# Patient Record
Sex: Female | Born: 1995 | Race: Black or African American | Hispanic: No | Marital: Single | Smoking: Never smoker
Health system: Southern US, Community
[De-identification: ages and names within clinical notes are randomized; demographics above are authoritative.]

## PROBLEM LIST (undated history)

## (undated) DIAGNOSIS — O139 Gestational [pregnancy-induced] hypertension without significant proteinuria, unspecified trimester: Secondary | ICD-10-CM

## (undated) HISTORY — DX: Gestational (pregnancy-induced) hypertension without significant proteinuria, unspecified trimester: O13.9

## (undated) HISTORY — PX: WISDOM TOOTH EXTRACTION: SHX21

---

## 2018-02-18 ENCOUNTER — Ambulatory Visit (HOSPITAL_COMMUNITY)
Admission: EM | Admit: 2018-02-18 | Discharge: 2018-02-18 | Disposition: A | Discharge status: Other Acute Inpatient Hospital | Payer: Self-pay

## 2018-04-30 ENCOUNTER — Encounter: Payer: Self-pay | Admitting: Family Medicine

## 2018-04-30 ENCOUNTER — Ambulatory Visit (INDEPENDENT_AMBULATORY_CARE_PROVIDER_SITE_OTHER): Payer: Medicaid Other

## 2018-04-30 DIAGNOSIS — Z789 Other specified health status: Secondary | ICD-10-CM

## 2018-04-30 DIAGNOSIS — Z3201 Encounter for pregnancy test, result positive: Secondary | ICD-10-CM

## 2018-04-30 LAB — POCT PREGNANCY, URINE: Preg Test, Ur: POSITIVE — AB

## 2018-04-30 NOTE — Progress Notes (Signed)
Pt here today for pregnancy test.  Resulted positive.   Pt denies any vaginal bleeding or pain.  Pt unsure of LMP but reports having a period in November but not in December.  Medications reconciled.  List of medicines safe to take in pregnancy given.  OB US scheduled due to pt unsure of LMP.  OB US scheduled for 05/07/18 @ 1545.  Proof of pregnancy letter given by the front office.

## 2018-05-01 NOTE — Progress Notes (Signed)
Chart reviewed for nurse visit. Agree with plan of care.   Fernandez Kenley Lauren, DO 05/01/2018 11:34 AM  

## 2018-05-06 ENCOUNTER — Encounter (HOSPITAL_COMMUNITY): Payer: Self-pay

## 2018-05-07 ENCOUNTER — Ambulatory Visit (HOSPITAL_COMMUNITY)
Admission: RE | Admit: 2018-05-07 | Discharge: 2018-05-07 | Disposition: A | Discharge status: Home / Self Care | Payer: Medicaid Other | Source: Ambulatory Visit | Attending: Family Medicine | Admitting: Family Medicine

## 2018-05-07 DIAGNOSIS — Z789 Other specified health status: Secondary | ICD-10-CM | POA: Insufficient documentation

## 2018-05-31 ENCOUNTER — Other Ambulatory Visit: Payer: Self-pay

## 2018-05-31 ENCOUNTER — Ambulatory Visit (INDEPENDENT_AMBULATORY_CARE_PROVIDER_SITE_OTHER): Payer: Medicaid Other | Admitting: *Deleted

## 2018-05-31 ENCOUNTER — Encounter: Payer: Self-pay | Admitting: Family Medicine

## 2018-05-31 ENCOUNTER — Other Ambulatory Visit (HOSPITAL_COMMUNITY)
Admission: RE | Admit: 2018-05-31 | Discharge: 2018-05-31 | Disposition: A | Discharge status: Home / Self Care | Payer: Medicaid Other | Source: Ambulatory Visit | Attending: Family Medicine | Admitting: Family Medicine

## 2018-05-31 VITALS — BP 121/77 | HR 88 | Ht 64.75 in | Wt 242.0 lb

## 2018-05-31 DIAGNOSIS — Z8759 Personal history of other complications of pregnancy, childbirth and the puerperium: Secondary | ICD-10-CM

## 2018-05-31 DIAGNOSIS — O0990 Supervision of high risk pregnancy, unspecified, unspecified trimester: Secondary | ICD-10-CM | POA: Diagnosis present

## 2018-05-31 DIAGNOSIS — Z8751 Personal history of pre-term labor: Secondary | ICD-10-CM

## 2018-05-31 LAB — POCT URINALYSIS DIP (DEVICE)
Bilirubin Urine: NEGATIVE
GLUCOSE, UA: NEGATIVE mg/dL
Hgb urine dipstick: NEGATIVE
Ketones, ur: NEGATIVE mg/dL
LEUKOCYTE UA: NEGATIVE
Nitrite: NEGATIVE
Protein, ur: NEGATIVE mg/dL
Specific Gravity, Urine: 1.025 (ref 1.005–1.030)
Urobilinogen, UA: 1 mg/dL (ref 0.0–1.0)
pH: 6.5 (ref 5.0–8.0)

## 2018-05-31 NOTE — Progress Notes (Signed)
New Ob intake completed and pregnancy information packet given. Pt reports hx of GHTN with 2nd pregnancy resulting in induction of labor @ 37 wks.. She also had preterm labor during both pregnancies however did not have PTD. Both babies born in [place], [place]. Labs drawn and [place] for anatomy scheduled.

## 2018-06-02 LAB — CULTURE, OB URINE

## 2018-06-02 LAB — URINE CULTURE, OB REFLEX: Organism ID, Bacteria: NO GROWTH

## 2018-06-03 ENCOUNTER — Telehealth: Payer: Self-pay

## 2018-06-03 NOTE — Telephone Encounter (Signed)
Called pt to advise of [place] SCHEDULED FOR 2/21 @ 3pm. Pt verbalized understanding.

## 2018-06-03 NOTE — Telephone Encounter (Signed)
-----   Message from Drucilla Schmidt Day, RN sent at 05/31/2018 12:54 PM EST ----- Regarding: [place] for anatomy Pt left office prior to [place] scheduling. Please notify her of [place] for anatomy scheduled on 2/21 @ 3 pm.

## 2018-06-04 LAB — GC/CHLAMYDIA PROBE AMP (~~LOC~~) NOT AT ARMC
Chlamydia: NEGATIVE
Neisseria Gonorrhea: NEGATIVE

## 2018-06-07 ENCOUNTER — Encounter (HOSPITAL_COMMUNITY): Payer: Self-pay

## 2018-06-07 ENCOUNTER — Ambulatory Visit (HOSPITAL_COMMUNITY)
Admission: RE | Admit: 2018-06-07 | Discharge: 2018-06-07 | Disposition: A | Discharge status: Home / Self Care | Payer: Medicaid Other | Source: Ambulatory Visit | Attending: Advanced Practice Midwife | Admitting: Advanced Practice Midwife

## 2018-06-07 DIAGNOSIS — O99212 Obesity complicating pregnancy, second trimester: Secondary | ICD-10-CM | POA: Diagnosis not present

## 2018-06-07 DIAGNOSIS — Z8759 Personal history of other complications of pregnancy, childbirth and the puerperium: Secondary | ICD-10-CM | POA: Diagnosis present

## 2018-06-07 DIAGNOSIS — Z3A17 17 weeks gestation of pregnancy: Secondary | ICD-10-CM

## 2018-06-07 DIAGNOSIS — Z8751 Personal history of pre-term labor: Secondary | ICD-10-CM | POA: Insufficient documentation

## 2018-06-07 DIAGNOSIS — Z363 Encounter for antenatal screening for malformations: Secondary | ICD-10-CM

## 2018-06-07 DIAGNOSIS — O132 Gestational [pregnancy-induced] hypertension without significant proteinuria, second trimester: Secondary | ICD-10-CM

## 2018-06-07 DIAGNOSIS — O0990 Supervision of high risk pregnancy, unspecified, unspecified trimester: Secondary | ICD-10-CM | POA: Diagnosis present

## 2018-06-11 ENCOUNTER — Other Ambulatory Visit (HOSPITAL_COMMUNITY): Payer: Self-pay | Admitting: *Deleted

## 2018-06-11 DIAGNOSIS — O132 Gestational [pregnancy-induced] hypertension without significant proteinuria, second trimester: Secondary | ICD-10-CM

## 2018-06-13 LAB — OBSTETRIC PANEL, INCLUDING HIV
Antibody Screen: NEGATIVE
Basophils Absolute: 0 10*3/uL (ref 0.0–0.2)
Basos: 0 %
EOS (ABSOLUTE): 0.1 10*3/uL (ref 0.0–0.4)
EOS: 1 %
HIV Screen 4th Generation wRfx: NONREACTIVE
Hematocrit: 34.8 % (ref 34.0–46.6)
Hemoglobin: 10.5 g/dL — ABNORMAL LOW (ref 11.1–15.9)
Hepatitis B Surface Ag: NEGATIVE
Immature Grans (Abs): 0 10*3/uL (ref 0.0–0.1)
Immature Granulocytes: 0 %
Lymphocytes Absolute: 2.2 10*3/uL (ref 0.7–3.1)
Lymphs: 25 %
MCH: 21.3 pg — ABNORMAL LOW (ref 26.6–33.0)
MCHC: 30.2 g/dL — ABNORMAL LOW (ref 31.5–35.7)
MCV: 70 fL — ABNORMAL LOW (ref 79–97)
Monocytes Absolute: 0.8 10*3/uL (ref 0.1–0.9)
Monocytes: 9 %
Neutrophils Absolute: 5.7 10*3/uL (ref 1.4–7.0)
Neutrophils: 65 %
Platelets: 209 10*3/uL (ref 150–450)
RBC: 4.94 x10E6/uL (ref 3.77–5.28)
RDW: 17.1 % — ABNORMAL HIGH (ref 11.7–15.4)
RH TYPE: POSITIVE
RPR: NONREACTIVE
Rubella Antibodies, IGG: 2.46 index (ref >0.99)
WBC: 8.8 10*3/uL (ref 3.4–10.8)

## 2018-06-13 LAB — HEMOGLOBINOPATHY EVALUATION
Ferritin: 8 ng/mL — ABNORMAL LOW (ref 15–150)
Hgb A2 Quant: 1.7 % — ABNORMAL LOW (ref 1.8–3.2)
Hgb A: 98.3 % (ref 96.4–98.8)
Hgb C: 0 %
Hgb F Quant: 0 % (ref 0.0–2.0)
Hgb S: 0 %
Hgb Solubility: NEGATIVE
Hgb Variant: 0 %

## 2018-06-13 LAB — INHERITEST(R) CF/SMA PANEL

## 2018-06-17 ENCOUNTER — Other Ambulatory Visit: Payer: Self-pay

## 2018-06-17 ENCOUNTER — Encounter: Payer: Self-pay | Admitting: Obstetrics & Gynecology

## 2018-06-17 ENCOUNTER — Encounter: Payer: Self-pay | Admitting: Family Medicine

## 2018-06-17 ENCOUNTER — Encounter: Payer: Medicaid Other | Admitting: Advanced Practice Midwife

## 2018-06-17 ENCOUNTER — Ambulatory Visit (INDEPENDENT_AMBULATORY_CARE_PROVIDER_SITE_OTHER): Payer: Medicaid Other | Admitting: Obstetrics & Gynecology

## 2018-06-17 ENCOUNTER — Other Ambulatory Visit (HOSPITAL_COMMUNITY)
Admission: RE | Admit: 2018-06-17 | Discharge: 2018-06-17 | Disposition: A | Discharge status: Home / Self Care | Payer: Medicaid Other | Source: Ambulatory Visit | Attending: Advanced Practice Midwife | Admitting: Advanced Practice Midwife

## 2018-06-17 VITALS — BP 123/66 | HR 99 | Wt 244.1 lb

## 2018-06-17 DIAGNOSIS — Z8759 Personal history of other complications of pregnancy, childbirth and the puerperium: Secondary | ICD-10-CM

## 2018-06-17 DIAGNOSIS — Z23 Encounter for immunization: Secondary | ICD-10-CM

## 2018-06-17 DIAGNOSIS — N898 Other specified noninflammatory disorders of vagina: Secondary | ICD-10-CM | POA: Diagnosis not present

## 2018-06-17 DIAGNOSIS — O0990 Supervision of high risk pregnancy, unspecified, unspecified trimester: Secondary | ICD-10-CM

## 2018-06-17 DIAGNOSIS — O0992 Supervision of high risk pregnancy, unspecified, second trimester: Secondary | ICD-10-CM

## 2018-06-17 DIAGNOSIS — Z3A19 19 weeks gestation of pregnancy: Secondary | ICD-10-CM

## 2018-06-17 MED ORDER — COMPLETENATE 29-1 MG PO CHEW: 1.0000 | CHEWABLE_TABLET | Freq: Every day | ORAL | Status: DC

## 2018-06-17 MED ORDER — ASPIRIN EC 81 MG PO TBEC: 81.0000 mg | DELAYED_RELEASE_TABLET | Freq: Every day | ORAL | 2 refills | Status: DC

## 2018-06-17 NOTE — Patient Instructions (Signed)

## 2018-06-17 NOTE — Progress Notes (Signed)
Subjective:    Felicia Valdez is a [id number] [redacted]w[redacted]d being seen today for her first obstetrical visit.  Her obstetrical history is significant for GHTN. Patient does intend to breast feed. Pregnancy history fully reviewed.  Patient reports that she is doing well.  She notes that she has not had prior prenatal care this pregnancy, and is not taking any medications, including prenatal vitamins.  She notes that her nausea has resolved and reports normal fetal movement.  She does note intermittant right lower quadrant pain to right periumbilical pain over the past couple of weeks.  She states the episodes last approximately 5 minustes and are a 5/10 in severity.  She has not taken anything for the pain.  .  Vitals:   06/17/18 0836  BP: 123/66  Pulse: 99  Weight: 244 lb 1.6 oz (110.7 kg)    HISTORY: OB History  Gravida Para Term Preterm AB Living  3 2 2     2   SAB TAB Ectopic Multiple Live Births          2    # Outcome Date GA Lbr Len/2nd Weight Sex Delivery Anes PTL Lv  3 Current           2 Term 05/26/17 [redacted]w[redacted]d  6 lb (2.722 kg) F  EPI Y LIV  1 Term 05/21/15 [redacted]w[redacted]d  6 lb 13 oz (3.09 kg) F Vag-Spont EPI N LIV   Past Medical History:  Diagnosis Date  . Pregnancy induced hypertension    with both pregnancies  . Preterm labor    preterm labor with both pregnancies   Past Surgical History:  Procedure Laterality Date  . WISDOM TOOTH EXTRACTION     Family History  Problem Relation Age of Onset  . Hypertension Mother      Exam    Uterus:     Pelvic Exam:    Perineum: No Hemorrhoids   Vulva: normal   Vagina:  White discharge, wet prep done    pH:    Cervix: multiparous appearance   Adnexa: normal adnexa   Bony Pelvis: gynecoid, proven to 3090 g  System: Breast:  normal appearance, no masses or tenderness, No nipple retraction or dimpling, No nipple discharge or bleeding, Normal to palpation without dominant masses, Taught monthly breast self examination   Skin: normal  coloration and turgor, no rashes    Neurologic: oriented, normal mood   Extremities: no deformities, no evidence of joint instability   HEENT neck supple with midline trachea and thyroid without masses   Mouth/Teeth mucous membranes moist, pharynx normal without lesions   Neck no masses   Cardiovascular: regular rate and rhythm, no murmurs or gallops   Respiratory:  appears well, vitals normal, no respiratory distress, acyanotic, normal RR, ear and throat exam is normal, neck free of mass or lymphadenopathy, chest clear, no wheezing, crepitations, rhonchi, normal symmetric air entry   Abdomen: soft, non-tender; bowel sounds normal; no masses,  no organomegaly   Urinary: urethral meatus normal      Assessment:    Pregnancy: [id number] Patient Active Problem List   Diagnosis Date Noted  . Supervision of high risk pregnancy, antepartum 05/31/2018  . History of gestational hypertension 05/31/2018  . History of preterm labor 05/31/2018        Plan:     Initial labs drawn. Prenatal vitamins. Aspirin 81 mg due to previous history of GHTN. Problem list reviewed and updated. Genetic Screening discussed AFP: ordered  Ultrasound discussed; fetal survey: results reviewed.  Follow up in 4 weeks. 50% of 25 min visit spent on counseling and coordination of care.    Francene Boyers 06/17/2018

## 2018-06-19 LAB — AFP, SERUM, OPEN SPINA BIFIDA
AFP MOM: 1.05
AFP Value: 43.7 ng/mL
Gest. Age on Collection Date: 19.2 weeks
Maternal Age At EDD: 23.9 yr
OSBR Risk 1 IN: 10000
Test Results:: NEGATIVE
WEIGHT: 244 [lb_av]

## 2018-06-20 ENCOUNTER — Other Ambulatory Visit (HOSPITAL_BASED_OUTPATIENT_CLINIC_OR_DEPARTMENT_OTHER): Payer: Self-pay | Admitting: Obstetrics & Gynecology

## 2018-06-20 DIAGNOSIS — O0990 Supervision of high risk pregnancy, unspecified, unspecified trimester: Secondary | ICD-10-CM

## 2018-06-20 LAB — CERVICOVAGINAL ANCILLARY ONLY
Bacterial vaginitis: POSITIVE — AB
Candida vaginitis: NEGATIVE
Chlamydia: NEGATIVE
Neisseria Gonorrhea: NEGATIVE
Trichomonas: NEGATIVE

## 2018-06-20 MED ORDER — METRONIDAZOLE 500 MG PO TABS: 500.0000 mg | ORAL_TABLET | Freq: Two times a day (BID) | ORAL | 0 refills | Status: AC

## 2018-06-20 NOTE — Progress Notes (Signed)
Flagyl for BV 

## 2018-06-24 ENCOUNTER — Other Ambulatory Visit: Payer: Self-pay

## 2018-06-24 DIAGNOSIS — N760 Acute vaginitis: Principal | ICD-10-CM

## 2018-06-24 DIAGNOSIS — B9689 Other specified bacterial agents as the cause of diseases classified elsewhere: Secondary | ICD-10-CM

## 2018-06-24 MED ORDER — METRONIDAZOLE 0.75 % VA GEL: 1.0000 | Freq: Every day | VAGINAL | 0 refills | Status: AC

## 2018-06-24 NOTE — Progress Notes (Signed)
Pt requests to switch from pills due to N/V.

## 2018-07-04 ENCOUNTER — Encounter: Payer: Self-pay | Admitting: Family Medicine

## 2018-07-04 ENCOUNTER — Telehealth: Payer: Self-pay | Admitting: *Deleted

## 2018-07-04 DIAGNOSIS — O219 Vomiting of pregnancy, unspecified: Secondary | ICD-10-CM

## 2018-07-04 MED ORDER — ONDANSETRON HCL 8 MG PO TABS: 8.0000 mg | ORAL_TABLET | Freq: Three times a day (TID) | ORAL | 0 refills | Status: DC | PRN

## 2018-07-04 NOTE — Telephone Encounter (Signed)
Pt sent mychart message requesting an appointment to be seen d/t persistent headaches, nausea, and vomiting.  Pt reports headaches for which she has taken tylenol and aspirin without relief, pt denies vision changes.  Pt reports she has not checked her blood pressure but her uncle has a machine that she can use to check her BP.  Recommended that she check her blood pressure, and if it is elevated, with the persistent headaches, advised pt to go to MAU. Pt reports nausea started on Monday 07/01/18.  Pt reports she is vomiting, and has diarrhea.  Pt reports having very loose stool 4-5 times per day which she says is a decrease from the previous days.  Discussed with Nolene Bernheim CNM who advised pt to take Imodium but no more than 2 doses.  Pt educated that sometimes, if she is fighting a stomach virus, the body has to let it pass through.  Zofran order sent to [facility] on  [address].  Pt verbalized understanding.

## 2018-07-05 ENCOUNTER — Ambulatory Visit (HOSPITAL_COMMUNITY): Payer: Medicaid Other | Admitting: *Deleted

## 2018-07-05 ENCOUNTER — Ambulatory Visit (HOSPITAL_COMMUNITY)
Admission: RE | Admit: 2018-07-05 | Discharge: 2018-07-05 | Disposition: A | Discharge status: Home / Self Care | Payer: Medicaid Other | Source: Ambulatory Visit | Attending: Obstetrics and Gynecology | Admitting: Obstetrics and Gynecology

## 2018-07-05 ENCOUNTER — Other Ambulatory Visit: Payer: Self-pay

## 2018-07-05 ENCOUNTER — Encounter (HOSPITAL_COMMUNITY): Payer: Self-pay

## 2018-07-05 VITALS — BP 112/65 | HR 93 | Temp 98.6°F | Wt 248.4 lb

## 2018-07-05 DIAGNOSIS — O132 Gestational [pregnancy-induced] hypertension without significant proteinuria, second trimester: Secondary | ICD-10-CM | POA: Insufficient documentation

## 2018-07-05 DIAGNOSIS — O0990 Supervision of high risk pregnancy, unspecified, unspecified trimester: Secondary | ICD-10-CM

## 2018-07-05 DIAGNOSIS — Z3A21 21 weeks gestation of pregnancy: Secondary | ICD-10-CM | POA: Diagnosis not present

## 2018-07-05 DIAGNOSIS — Z8751 Personal history of pre-term labor: Secondary | ICD-10-CM

## 2018-07-05 DIAGNOSIS — Z8759 Personal history of other complications of pregnancy, childbirth and the puerperium: Secondary | ICD-10-CM

## 2018-07-05 DIAGNOSIS — O99212 Obesity complicating pregnancy, second trimester: Secondary | ICD-10-CM | POA: Diagnosis not present

## 2018-07-05 DIAGNOSIS — O09292 Supervision of pregnancy with other poor reproductive or obstetric history, second trimester: Secondary | ICD-10-CM | POA: Diagnosis not present

## 2018-07-05 DIAGNOSIS — Z362 Encounter for other antenatal screening follow-up: Secondary | ICD-10-CM

## 2018-07-09 ENCOUNTER — Encounter: Payer: Self-pay | Admitting: *Deleted

## 2018-07-15 ENCOUNTER — Telehealth: Payer: Self-pay | Admitting: Obstetrics and Gynecology

## 2018-07-15 ENCOUNTER — Encounter: Payer: Medicaid Other | Admitting: Obstetrics and Gynecology

## 2018-07-15 NOTE — Telephone Encounter (Signed)
Called the patient to inform of COVID19 restrictions and retrieve information on the status of cuffs. No answer, left a voicemail.

## 2018-07-15 NOTE — Telephone Encounter (Signed)
Postponed the patient's visit per provider. Unsure of the cuffs as the patient did not return the call to our clinic. Sending the patient a missed appointment reminder and a reminder of the new scheduled appointment.

## 2018-07-30 ENCOUNTER — Telehealth: Payer: Self-pay | Admitting: Family Medicine

## 2018-07-30 NOTE — Telephone Encounter (Signed)
Attempted to call patient with her next in person office visit. No answer, left message with the appointment information (5/5 @ 8:50) . Also informed to come fasting for 28 week labs. Advised to call office with any concerns.

## 2018-08-02 ENCOUNTER — Encounter: Payer: Medicaid Other | Admitting: Obstetrics & Gynecology

## 2018-08-14 NOTE — Telephone Encounter (Signed)
Opened in error

## 2018-08-20 ENCOUNTER — Other Ambulatory Visit: Payer: Medicaid Other

## 2018-08-20 ENCOUNTER — Other Ambulatory Visit: Payer: Self-pay

## 2018-08-20 DIAGNOSIS — O0990 Supervision of high risk pregnancy, unspecified, unspecified trimester: Secondary | ICD-10-CM

## 2018-08-21 ENCOUNTER — Other Ambulatory Visit: Payer: Self-pay | Admitting: Advanced Practice Midwife

## 2018-08-21 LAB — CBC
Hematocrit: 30.2 % — ABNORMAL LOW (ref 34.0–46.6)
Hemoglobin: 9.6 g/dL — ABNORMAL LOW (ref 11.1–15.9)
MCH: 20.7 pg — ABNORMAL LOW (ref 26.6–33.0)
MCHC: 31.8 g/dL (ref 31.5–35.7)
MCV: 65 fL — ABNORMAL LOW (ref 79–97)
Platelets: 254 10*3/uL (ref 150–450)
RBC: 4.63 x10E6/uL (ref 3.77–5.28)
RDW: 15.8 % — ABNORMAL HIGH (ref 11.7–15.4)
WBC: 12.6 10*3/uL — ABNORMAL HIGH (ref 3.4–10.8)

## 2018-08-21 LAB — GLUCOSE TOLERANCE, 2 HOURS W/ 1HR
Glucose, 1 hour: 107 mg/dL (ref 65–179)
Glucose, 2 hour: 73 mg/dL (ref 65–152)
Glucose, Fasting: 81 mg/dL (ref 65–91)

## 2018-08-21 LAB — HIV ANTIBODY (ROUTINE TESTING W REFLEX): HIV Screen 4th Generation wRfx: NONREACTIVE

## 2018-08-21 LAB — RPR: RPR Ser Ql: NONREACTIVE

## 2018-08-21 MED ORDER — FERROUS SULFATE DRIED ER 160 (50 FE) MG PO TBCR: 1.0000 | EXTENDED_RELEASE_TABLET | ORAL | 2 refills | Status: DC

## 2018-08-21 NOTE — Progress Notes (Signed)
Anemia noted on 28 week labs. Pt notified via MyChart. Clayton Bibles, CNM 08/21/18 7:41 AM

## 2018-08-21 NOTE — Progress Notes (Signed)
LM for pt that we have sent an Rx to her [facility] pharmacy on [address] And to please give the office a call back or respond to MyChart message.

## 2018-09-04 ENCOUNTER — Other Ambulatory Visit: Payer: Self-pay

## 2018-09-04 ENCOUNTER — Encounter: Payer: Self-pay | Admitting: Obstetrics and Gynecology

## 2018-09-04 ENCOUNTER — Telehealth (INDEPENDENT_AMBULATORY_CARE_PROVIDER_SITE_OTHER): Payer: Medicaid Other | Admitting: Obstetrics and Gynecology

## 2018-09-04 DIAGNOSIS — Z8751 Personal history of pre-term labor: Secondary | ICD-10-CM

## 2018-09-04 DIAGNOSIS — Z3A30 30 weeks gestation of pregnancy: Secondary | ICD-10-CM

## 2018-09-04 DIAGNOSIS — O0930 Supervision of pregnancy with insufficient antenatal care, unspecified trimester: Secondary | ICD-10-CM | POA: Insufficient documentation

## 2018-09-04 DIAGNOSIS — Z8759 Personal history of other complications of pregnancy, childbirth and the puerperium: Secondary | ICD-10-CM

## 2018-09-04 DIAGNOSIS — O0990 Supervision of high risk pregnancy, unspecified, unspecified trimester: Secondary | ICD-10-CM

## 2018-09-04 DIAGNOSIS — O0993 Supervision of high risk pregnancy, unspecified, third trimester: Secondary | ICD-10-CM

## 2018-09-04 MED ORDER — AMBULATORY NON FORMULARY MEDICATION: 1.0000 | 0 refills | Status: DC

## 2018-09-04 MED ORDER — ASPIRIN EC 81 MG PO TBEC: 81.0000 mg | DELAYED_RELEASE_TABLET | Freq: Every day | ORAL | 2 refills | Status: DC

## 2018-09-04 NOTE — Progress Notes (Signed)
   TELEHEALTH OBSTETRICS PRENATAL VIRTUAL VIDEO VISIT ENCOUNTER NOTE  Provider location: Center for [facility] at [facility]   I connected with Felicia Valdez on 09/04/18 at  2:15 PM EDT by MyChart Video Encounter at home and verified that I am speaking with the correct person using two identifiers.   I discussed the limitations, risks, security and privacy concerns of performing an evaluation and management service by telephone and the availability of in person appointments. I also discussed with the patient that there may be a patient responsible charge related to this service. The patient expressed understanding and agreed to proceed. Subjective:  Felicia Valdez is a 23 y.o. G3P2002 at [redacted]w[redacted]d being seen today for ongoing prenatal care.  She is currently monitored for the following issues for this high-risk pregnancy and has Supervision of high risk pregnancy, antepartum; History of gestational hypertension; History of preterm labor; and Late prenatal care on their problem list.  Patient reports occasional contractions.  Contractions: Irritability. Vag. Bleeding: None.  Movement: Present. Denies any leaking of fluid.   The following portions of the patient's history were reviewed and updated as appropriate: allergies, current medications, past family history, past medical history, past social history, past surgical history and problem list.   Objective:  There were no vitals filed for this visit.  Fetal Status:     Movement: Present     General:  Alert, oriented and cooperative. Patient is in no acute distress.  Respiratory: Normal respiratory effort, no problems with respiration noted  Mental Status: Normal mood and affect. Normal behavior. Normal judgment and thought content.  Rest of physical exam deferred due to type of encounter  Imaging: No results found.  Assessment and Plan:  Pregnancy: G3P2002 at [redacted]w[redacted]d  1. Supervision of high risk pregnancy, antepartum  Patient has been enrolled into babyscripts and will have a BP cuff sent to her house Instructions sent to mmychart about how to check her BP at home once cuff arrives  2. History of gestational hypertension No cuff Has been induced with second pregnancy due to gHTN  3. History of preterm labor No s/s labor   Preterm labor symptoms and general obstetric precautions including but not limited to vaginal bleeding, contractions, leaking of fluid and fetal movement were reviewed in detail with the patient. I discussed the assessment and treatment plan with the patient. The patient was provided an opportunity to ask questions and all were answered. The patient agreed with the plan and demonstrated an understanding of the instructions. The patient was advised to call back or seek an in-person office evaluation/go to MAU at [facility] for any urgent or concerning symptoms. Please refer to After Visit Summary for other counseling recommendations.   I provided 11 minutes of face-to-face time during this encounter.  Return in about 2 weeks (around 09/18/2018) for OB visit (MD), virtual.  No future appointments.  Conan Bowens, MD Center for [facility], [facility]

## 2018-09-18 ENCOUNTER — Other Ambulatory Visit: Payer: Self-pay

## 2018-09-18 ENCOUNTER — Telehealth: Payer: Self-pay | Admitting: Nurse Practitioner

## 2018-09-18 ENCOUNTER — Encounter: Payer: Self-pay | Admitting: Nurse Practitioner

## 2018-09-18 ENCOUNTER — Telehealth: Payer: Self-pay

## 2018-09-18 ENCOUNTER — Telehealth (INDEPENDENT_AMBULATORY_CARE_PROVIDER_SITE_OTHER): Payer: Medicaid Other | Admitting: Nurse Practitioner

## 2018-09-18 DIAGNOSIS — O99891 Other specified diseases and conditions complicating pregnancy: Secondary | ICD-10-CM | POA: Insufficient documentation

## 2018-09-18 DIAGNOSIS — O9989 Other specified diseases and conditions complicating pregnancy, childbirth and the puerperium: Secondary | ICD-10-CM

## 2018-09-18 DIAGNOSIS — Z8751 Personal history of pre-term labor: Secondary | ICD-10-CM

## 2018-09-18 DIAGNOSIS — M549 Dorsalgia, unspecified: Secondary | ICD-10-CM | POA: Insufficient documentation

## 2018-09-18 DIAGNOSIS — Z8759 Personal history of other complications of pregnancy, childbirth and the puerperium: Secondary | ICD-10-CM

## 2018-09-18 DIAGNOSIS — O0993 Supervision of high risk pregnancy, unspecified, third trimester: Secondary | ICD-10-CM

## 2018-09-18 DIAGNOSIS — O0990 Supervision of high risk pregnancy, unspecified, unspecified trimester: Secondary | ICD-10-CM

## 2018-09-18 DIAGNOSIS — Z3A32 32 weeks gestation of pregnancy: Secondary | ICD-10-CM

## 2018-09-18 DIAGNOSIS — O0933 Supervision of pregnancy with insufficient antenatal care, third trimester: Secondary | ICD-10-CM | POA: Diagnosis not present

## 2018-09-18 DIAGNOSIS — O0930 Supervision of pregnancy with insufficient antenatal care, unspecified trimester: Secondary | ICD-10-CM

## 2018-09-18 MED ORDER — COMFORT FIT MATERNITY SUPP LG MISC: 0 refills | Status: AC

## 2018-09-18 NOTE — Progress Notes (Signed)
11:09 I called [place] and left a message I am calling for her virtual visit and will call again in a few minutes- please be available in virtual room or by phone.  Linda,RN

## 2018-09-18 NOTE — Telephone Encounter (Signed)
Attempted to call patient with her next office OB appointment. No answer, left detailed message with appointment information ( 6/12 @ 11:15 - office). Informed to wear a face mask and no visitors are allowed with her. Advised to give the office a call back if she is needing to reschedule. Reminder letter mailed

## 2018-09-18 NOTE — Patient Instructions (Signed)
Braxton Hicks Contractions Contractions of the uterus can occur throughout pregnancy, but they are not always a sign that you are in labor. You may have practice contractions called Braxton Hicks contractions. These false labor contractions are sometimes confused with true labor. What are Braxton Hicks contractions? Braxton Hicks contractions are tightening movements that occur in the muscles of the uterus before labor. Unlike true labor contractions, these contractions do not result in opening (dilation) and thinning of the cervix. Toward the end of pregnancy (32-34 weeks), Braxton Hicks contractions can happen more often and may become stronger. These contractions are sometimes difficult to tell apart from true labor because they can be very uncomfortable. You should not feel embarrassed if you go to the hospital with false labor. Sometimes, the only way to tell if you are in true labor is for your health care provider to look for changes in the cervix. The health care provider will do a physical exam and may monitor your contractions. If you are not in true labor, the exam should show that your cervix is not dilating and your water has not broken. If there are no other health problems associated with your pregnancy, it is completely safe for you to be sent home with false labor. You may continue to have Braxton Hicks contractions until you go into true labor. How to tell the difference between true labor and false labor True labor  Contractions last 30-70 seconds.  Contractions become very regular.  Discomfort is usually felt in the top of the uterus, and it spreads to the lower abdomen and low back.  Contractions do not go away with walking.  Contractions usually become more intense and increase in frequency.  The cervix dilates and gets thinner. False labor  Contractions are usually shorter and not as strong as true labor contractions.  Contractions are usually irregular.  Contractions  are often felt in the front of the lower abdomen and in the groin.  Contractions may go away when you walk around or change positions while lying down.  Contractions get weaker and are shorter-lasting as time goes on.  The cervix usually does not dilate or become thin. Follow these instructions at home:   Take over-the-counter and prescription medicines only as told by your health care provider.  Keep up with your usual exercises and follow other instructions from your health care provider.  Eat and drink lightly if you think you are going into labor.  If Braxton Hicks contractions are making you uncomfortable: ? Change your position from lying down or resting to walking, or change from walking to resting. ? Sit and rest in a tub of warm water. ? Drink enough fluid to keep your urine pale yellow. Dehydration may cause these contractions. ? Do slow and deep breathing several times an hour.  Keep all follow-up prenatal visits as told by your health care provider. This is important. Contact a health care provider if:  You have a fever.  You have continuous pain in your abdomen. Get help right away if:  Your contractions become stronger, more regular, and closer together.  You have fluid leaking or gushing from your vagina.  You pass blood-tinged mucus (bloody show).  You have bleeding from your vagina.  You have low back pain that you never had before.  You feel your baby's head pushing down and causing pelvic pressure.  Your baby is not moving inside you as much as it used to. Summary  Contractions that occur before labor are   called Braxton Hicks contractions, false labor, or practice contractions.  Braxton Hicks contractions are usually shorter, weaker, farther apart, and less regular than true labor contractions. True labor contractions usually become progressively stronger and regular, and they become more frequent.  Manage discomfort from Braxton Hicks contractions  by changing position, resting in a warm bath, drinking plenty of water, or practicing deep breathing. This information is not intended to replace advice given to you by your health care provider. Make sure you discuss any questions you have with your health care provider. Document Released: 08/17/2016 Document Revised: 01/16/2017 Document Reviewed: 08/17/2016 Elsevier Interactive Patient Education  2019 Elsevier Inc.  

## 2018-09-18 NOTE — Progress Notes (Unsigned)
Patient ID: [place] P [place], female   DOB: [birth year], 23 y.o.   MRN: [id number]   Spoke with at [facility] [phone]) to have patient scheduled for her referral. The office will be reaching out to patient to have her scheduled because they have an opening on 6/4 if she is able to make it.

## 2018-09-18 NOTE — Telephone Encounter (Signed)
Called pt to advise that her [facility] Rx is ready for pickup, let her know where to pick up & that it will be at front desk.No answer left VM.

## 2018-09-18 NOTE — Progress Notes (Signed)
TELEHEALTH VIRTUAL OBSTETRICS VISIT ENCOUNTER NOTE  I connected with Felicia Valdez and Felicia Valdez on 09/18/18 at 11:15 AM EDT by telephone at home and verified that I am speaking with the correct person using two identifiers.   I discussed the limitations, risks, security and privacy concerns of performing an evaluation and management service by telephone and the availability of in person appointments. I also discussed with the patient that there may be a patient responsible charge related to this service. The patient expressed understanding and agreed to proceed. MyChart visit was scheduled however, there was no microphone and I could not hear the patient, so we moved to a telephone visit.  Subjective:  Felicia Valdez is a 23 y.o. G3P2002 at 7454w4d being followed for ongoing prenatal care.  She is currently monitored for the following issues for this high-risk pregnancy and has Supervision of high risk pregnancy, antepartum; History of gestational hypertension; History of preterm labor; and Late prenatal care on their problem list.  Patient reports back pain, pain from walking, and family is very worried about preterm labor and wants her taken out of work..  Client wants to work.  Rides 3 busses to her job.  Currently at work today.  Reports fetal movement. Has a few contractions that she thinks are [employer] CorporationBraxton Hicks.  Denies any bleeding or leaking of fluid.   The following portions of the patient's history were reviewed and updated as appropriate: allergies, current medications, past family history, past medical history, past social history, past surgical history and problem list.   Objective:   General:  Alert, oriented and cooperative.   Mental Status: Normal mood and affect perceived. Normal judgment and thought content.  Rest of physical exam deferred due to type of encounter Is taking her BP weekly and recording.  Does not have her sheet with her at work but states all have been under 140/90.   Assessment and Plan:  Pregnancy: G3P2002 at 3454w4d 1. Supervision of high risk pregnancy, antepartum Prescribed maternity support belt.  Client left message to pick up prescription and where to pick up maternity belt.  Advised the use of ice on her back at night daily for 15 minutes.  2. History of gestational hypertension Has been checking BPs and have been normal - see info in note  3. History of preterm labor Having occasional contraction but is very worried about going into labor early as she did with other pregnancies - due to client's and her family's concerns, thought it was best to bring her into the office for a cervical exam.  Family wants her to be written out of work for the pregnancy, but client wants to work.  Takes 3 buses to get to work and thinks all of that is causing more pain.  Will work on ways to decrease some of the musculoskeletal pain in pregnancy and will reevaluate at a visit in person to make a further plan.  4. Leg pain when walking - Pubic symphysis discomfort Pain in Groin and cannot lift leg without pain indicative of Pubic Symphysis Discomfort.  Needs to have demonstration of appropriate stretching exercises.  Will refer to chiropractor to see if they can help  Preterm labor symptoms and general obstetric precautions including but not limited to vaginal bleeding, contractions, leaking of fluid and fetal movement were reviewed in detail with the patient.  I discussed the assessment and treatment plan with the patient. The patient was provided an opportunity to ask questions and all were answered. The  patient agreed with the plan and demonstrated an understanding of the instructions. The patient was advised to call back or seek an in-person office evaluation/go to MAU at [facility] for any urgent or concerning symptoms. Please refer to After Visit Summary for other counseling recommendations.   I provided 14 minutes of non-face-to-face time during  this encounter.  Return in about 1 week (around 09/25/2018) for in person visit for preterm labor concerns - check cervix and pubic symphysis discomfort.  Future Appointments  Date Time Provider Department Center  09/27/2018 11:15 AM Allie Bossier, MD [facility]    Currie Paris, NP Center for [employer], [facility] 1214 pm

## 2018-09-24 ENCOUNTER — Telehealth (INDEPENDENT_AMBULATORY_CARE_PROVIDER_SITE_OTHER): Payer: Medicaid Other | Admitting: *Deleted

## 2018-09-24 DIAGNOSIS — O4690 Antepartum hemorrhage, unspecified, unspecified trimester: Secondary | ICD-10-CM

## 2018-09-24 NOTE — Telephone Encounter (Signed)
Pt states she was recently at the hospital and today had noticed blood when wiping.  Pt requesting a call back.   Returned pt's call and pt stated that she went to the hospital on Sunday because she was having major leg pain and they checked her cervix and she was 1-2 cm dilated.  She states that yesterday she had a scant amount of vaginal bleeding and today, the bleeding amount has increased some but is still only present when she wipes.  Advised pt to go to MAU to be evaluated.  Pt states she is currently out of town and that's where she went to the ED.  Recommended pt go to ED again.  Pt verbalized understanding.

## 2018-09-25 ENCOUNTER — Telehealth: Payer: Self-pay | Admitting: Family Medicine

## 2018-09-25 NOTE — Telephone Encounter (Signed)
Called and spoke to patient she was instructed to wear a face mask and not to bring a visitor. Patient do not have symptoms.

## 2018-09-27 ENCOUNTER — Other Ambulatory Visit: Payer: Self-pay

## 2018-09-27 ENCOUNTER — Ambulatory Visit (INDEPENDENT_AMBULATORY_CARE_PROVIDER_SITE_OTHER): Payer: Medicaid Other | Admitting: Obstetrics & Gynecology

## 2018-09-27 VITALS — BP 116/67 | HR 91 | Temp 98.2°F | Wt 247.4 lb

## 2018-09-27 DIAGNOSIS — O99210 Obesity complicating pregnancy, unspecified trimester: Secondary | ICD-10-CM | POA: Insufficient documentation

## 2018-09-27 DIAGNOSIS — O99013 Anemia complicating pregnancy, third trimester: Secondary | ICD-10-CM

## 2018-09-27 DIAGNOSIS — Z8751 Personal history of pre-term labor: Secondary | ICD-10-CM

## 2018-09-27 DIAGNOSIS — O99213 Obesity complicating pregnancy, third trimester: Secondary | ICD-10-CM

## 2018-09-27 DIAGNOSIS — O99019 Anemia complicating pregnancy, unspecified trimester: Secondary | ICD-10-CM | POA: Insufficient documentation

## 2018-09-27 DIAGNOSIS — Z8759 Personal history of other complications of pregnancy, childbirth and the puerperium: Secondary | ICD-10-CM

## 2018-09-27 DIAGNOSIS — Z3A33 33 weeks gestation of pregnancy: Secondary | ICD-10-CM

## 2018-09-27 DIAGNOSIS — O0990 Supervision of high risk pregnancy, unspecified, unspecified trimester: Secondary | ICD-10-CM

## 2018-09-27 NOTE — Progress Notes (Signed)
   PRENATAL VISIT NOTE  Subjective:  Felicia Valdez is a 23 y.o. G3P2002 at [redacted]w[redacted]d being seen today for ongoing prenatal care.  She is currently monitored for the following issues for this high-risk pregnancy and has Supervision of high risk pregnancy, antepartum; History of gestational hypertension; History of preterm labor; Late prenatal care; Back pain affecting pregnancy in third trimester; Obesity in pregnancy; and Anemia in pregnancy on their problem list.  Patient reports She would like to be written out of work, has to take 3 buses to get to work..  Contractions: Irritability. Vag. Bleeding: None.  Movement: Present. Denies leaking of fluid.   The following portions of the patient's history were reviewed and updated as appropriate: allergies, current medications, past family history, past medical history, past social history, past surgical history and problem list.   Objective:   Vitals:   09/27/18 1119  BP: 116/67  Pulse: 91  Temp: 98.2 F (36.8 C)  Weight: 247 lb 6.4 oz (112.2 kg)    Fetal Status: Fetal Heart Rate (bpm): 147   Movement: Present     General:  Alert, oriented and cooperative. Patient is in no acute distress.  Skin: Skin is warm and dry. No rash noted.   Cardiovascular: Normal heart rate noted  Respiratory: Normal respiratory effort, no problems with respiration noted  Abdomen: Soft, gravid, appropriate for gestational age.  Pain/Pressure: Present     Pelvic: Cervical exam performed       external os opened 2, internal os closed  Extremities: Normal range of motion.  Edema: Trace  Mental Status: Normal mood and affect. Normal behavior. Normal judgment and thought content.   Assessment and Plan:  Pregnancy: G3P2002 at [redacted]w[redacted]d 1. Supervision of high risk pregnancy, antepartum - She does have a BP cuff at home.  2. 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 I explained that I cannot help with that  3. History of gestational hypertension  4. Obesity in pregnancy - maternity belt prescription given  5. Anemia during pregnancy in third trimester - rec OTC iron pills 1-2 times per day  Preterm labor symptoms and general obstetric precautions including but not limited to vaginal bleeding, contractions, leaking of fluid and fetal movement were reviewed in detail with the patient. Please refer to After Visit Summary for other counseling recommendations.   Return in about 1 week (around 10/04/2018) for virtual.  No future appointments.  Emily Filbert, MD

## 2018-10-02 ENCOUNTER — Telehealth: Payer: Self-pay | Admitting: Medical

## 2018-10-02 NOTE — Telephone Encounter (Signed)
The patient notes are charted she is aware of the upcoming appointment. No reminder call made.

## 2018-10-04 ENCOUNTER — Other Ambulatory Visit: Payer: Self-pay

## 2018-10-04 ENCOUNTER — Other Ambulatory Visit: Payer: Self-pay | Admitting: General Practice

## 2018-10-04 ENCOUNTER — Telehealth (INDEPENDENT_AMBULATORY_CARE_PROVIDER_SITE_OTHER): Payer: Medicaid Other | Admitting: Obstetrics & Gynecology

## 2018-10-04 DIAGNOSIS — O99210 Obesity complicating pregnancy, unspecified trimester: Secondary | ICD-10-CM

## 2018-10-04 DIAGNOSIS — Z8759 Personal history of other complications of pregnancy, childbirth and the puerperium: Secondary | ICD-10-CM

## 2018-10-04 DIAGNOSIS — Z3A34 34 weeks gestation of pregnancy: Secondary | ICD-10-CM

## 2018-10-04 DIAGNOSIS — O0990 Supervision of high risk pregnancy, unspecified, unspecified trimester: Secondary | ICD-10-CM

## 2018-10-04 DIAGNOSIS — O99213 Obesity complicating pregnancy, third trimester: Secondary | ICD-10-CM

## 2018-10-04 DIAGNOSIS — O0933 Supervision of pregnancy with insufficient antenatal care, third trimester: Secondary | ICD-10-CM

## 2018-10-04 DIAGNOSIS — O99013 Anemia complicating pregnancy, third trimester: Secondary | ICD-10-CM

## 2018-10-04 DIAGNOSIS — O0930 Supervision of pregnancy with insufficient antenatal care, unspecified trimester: Secondary | ICD-10-CM

## 2018-10-04 DIAGNOSIS — Z8751 Personal history of pre-term labor: Secondary | ICD-10-CM

## 2018-10-04 NOTE — Progress Notes (Signed)
Scheduled ultrasound 6/25 @ 2:45pm

## 2018-10-04 NOTE — Progress Notes (Signed)
I connected with  Felicia Valdez on 10/04/18 at 11:15 AM EDT by mychart video and verified that I am speaking with the correct person using two identifiers.   I discussed the limitations, risks, security and privacy concerns of performing an evaluation and management service by telephone and the availability of in person appointments. I also discussed with the patient that there may be a patient responsible charge related to this service. The patient expressed understanding and agreed to proceed.  Derinda Late, RN 10/04/2018  11:19 AM

## 2018-10-04 NOTE — Progress Notes (Signed)
   [facility] VIRTUAL VIDEO VISIT ENCOUNTER NOTE  Provider location: [facility] [employer]   I connected with Felicia P Craigo on 10/04/18 at 11:15 AM EDT by WebEx Video Encounter at home and verified that I am speaking with the correct person using two identifiers.   I discussed the limitations, risks, security and privacy concerns of performing an evaluation and management service by telephone and the availability of in person appointments. I also discussed with the patient that there may be a patient responsible charge related to this service. The patient expressed understanding and agreed to proceed. Subjective:  Felicia Valdez is a 23 y.o. G3P2002 at [redacted]w[redacted]d being seen today for ongoing prenatal care.  She is currently monitored for the following issues for this high-risk pregnancy and has Supervision of high risk pregnancy, antepartum; History of gestational hypertension; History of preterm labor; Late prenatal care; Back pain affecting pregnancy in third trimester; Obesity in pregnancy; and Anemia in pregnancy on their problem list.  Patient reports pain in her legs bilaterally worse at the end of the day. Not improved with rest. This has been present the entire pregnancy. .  Contractions: Not present. Vag. Bleeding: None.  Movement: Present. Denies any leaking of fluid.   The following portions of the patient's history were reviewed and updated as appropriate: allergies, current medications, past family history, past medical history, past social history, past surgical history and problem list.   Objective:   Vitals:   10/04/18 1120  BP: 113/69  Pulse: (!) 103    Fetal Status:     Movement: Present     General:  Alert, oriented and cooperative. Patient is in no acute distress.  Respiratory: Normal respiratory effort, no problems with respiration noted  Mental Status: Normal mood and affect. Normal behavior. Normal judgment and thought  content.  Rest of physical exam deferred due to type of encounter  Imaging: No results found.  Assessment and Plan:  Pregnancy: G3P2002 at [redacted]w[redacted]d 1. Obesity in pregnancy Need f/u US for growth  2. Anemia during pregnancy in third trimester Not taking her FeSO4 recommend FeSO4 bid  3. Late prenatal care  4. Supervision of high risk pregnancy, antepartum  5. History of gestational hypertension Has BP cuff at home.   6. History of preterm labor No s/sx of PTL   Preterm labor symptoms and general obstetric precautions including but not limited to vaginal bleeding, contractions, leaking of fluid and fetal movement were reviewed in detail with the patient. I discussed the assessment and treatment plan with the patient. The patient was provided an opportunity to ask questions and all were answered. The patient agreed with the plan and demonstrated an understanding of the instructions. The patient was advised to call back or seek an in-person office evaluation/go to [facility] at [facility] for any urgent or concerning symptoms. Please refer to After Visit Summary for other counseling recommendations.   I provided 16 minutes of face-to-face time during this encounter.  No follow-ups on file.  No future appointments.  Lavonia Drafts, MD [facility], [place]

## 2018-10-10 ENCOUNTER — Ambulatory Visit (HOSPITAL_COMMUNITY): Payer: Medicaid Other | Attending: Obstetrics and Gynecology

## 2018-10-21 ENCOUNTER — Telehealth: Payer: Self-pay | Admitting: Obstetrics and Gynecology

## 2018-10-21 NOTE — Telephone Encounter (Signed)
°  Attempted to call patient to ask her about any symptoms, and to wear her mask the whole visit covering her nose and mouth. Also, to sanitize her hands upon arriving as well as no visitors. Left a VM.

## 2018-10-22 ENCOUNTER — Encounter: Payer: Medicaid Other | Admitting: Obstetrics and Gynecology

## 2018-11-01 ENCOUNTER — Telehealth: Payer: Medicaid Other | Admitting: Obstetrics & Gynecology

## 2018-11-08 ENCOUNTER — Telehealth: Payer: Medicaid Other | Admitting: Family Medicine

## 2018-11-11 ENCOUNTER — Encounter: Payer: Medicaid Other | Admitting: Family Medicine

## 2018-11-11 ENCOUNTER — Other Ambulatory Visit: Payer: Medicaid Other

## 2018-11-25 ENCOUNTER — Encounter: Payer: Self-pay | Admitting: *Deleted

## 2018-12-03 ENCOUNTER — Ambulatory Visit: Payer: Medicaid Other | Admitting: Nurse Practitioner

## 2018-12-03 ENCOUNTER — Telehealth: Payer: Self-pay | Admitting: Medical

## 2018-12-25 NOTE — Telephone Encounter (Signed)
Opened in error

## 2019-02-01 ENCOUNTER — Encounter (HOSPITAL_COMMUNITY): Payer: Self-pay

## 2020-06-12 IMAGING — US US OB COMP LESS 14 WK
1 series · 15 of 28 positions shown · non-contrast
Comparison: None

CLINICAL DATA: Pregnant, uncertain dating

EXAM:
OBSTETRIC <14 WK ULTRASOUND
TECHNIQUE: Transabdominal ultrasound was performed for evaluation of the
gestation as well as the maternal uterus and adnexal regions.

[Series 1: us ob comp less 14 wk · 15 of 50 slices shown]
[im 1/50]
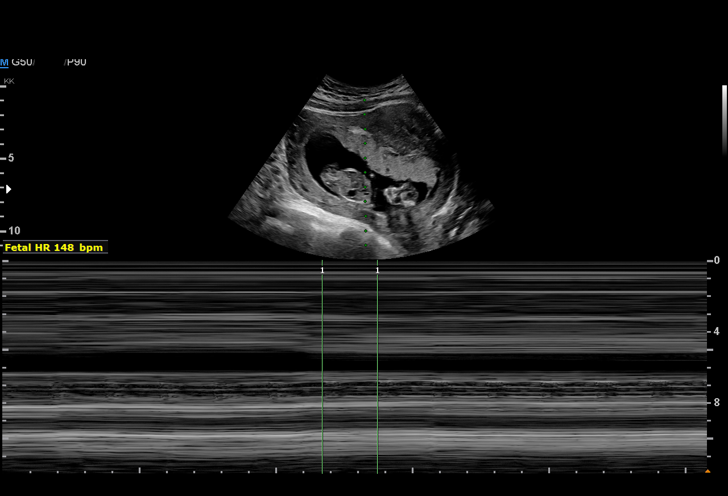
[im 4/50]
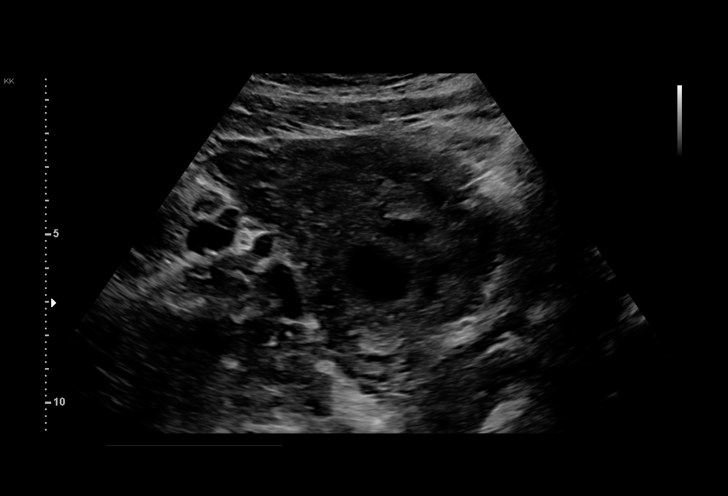
[im 8/50]
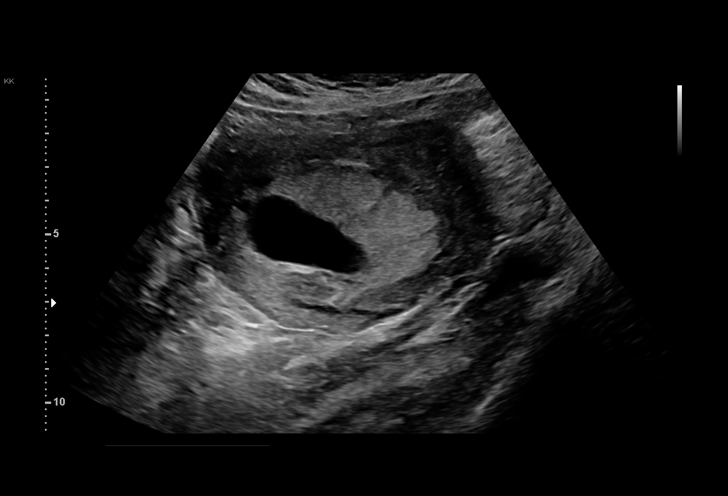
[im 11/50]
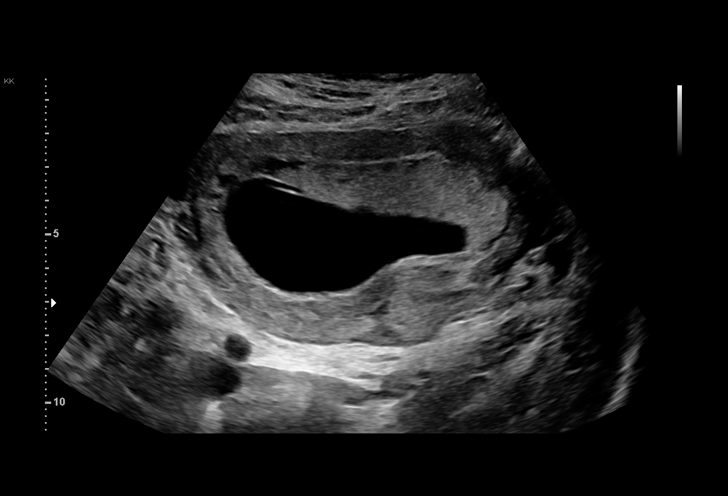
[im 15/50]
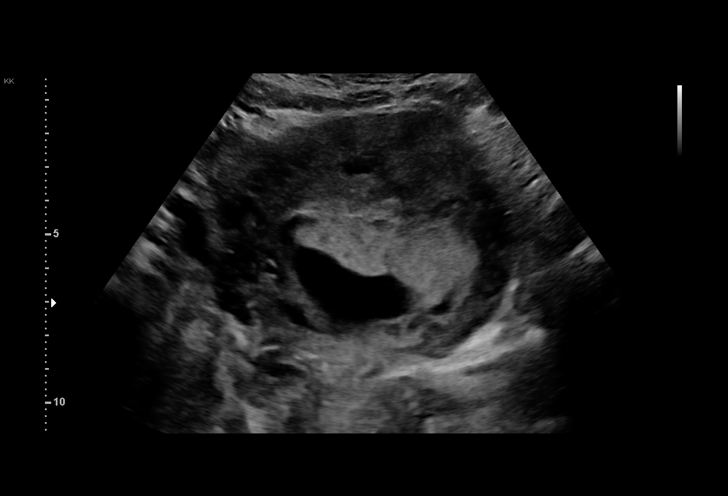
[im 19/50]
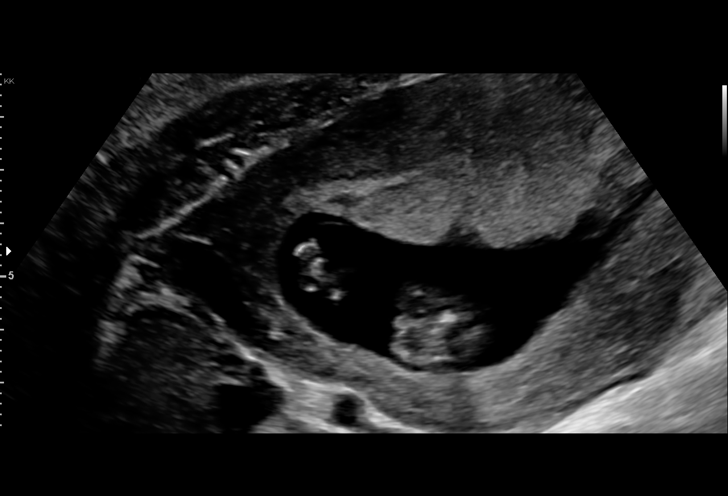
[im 22/50]
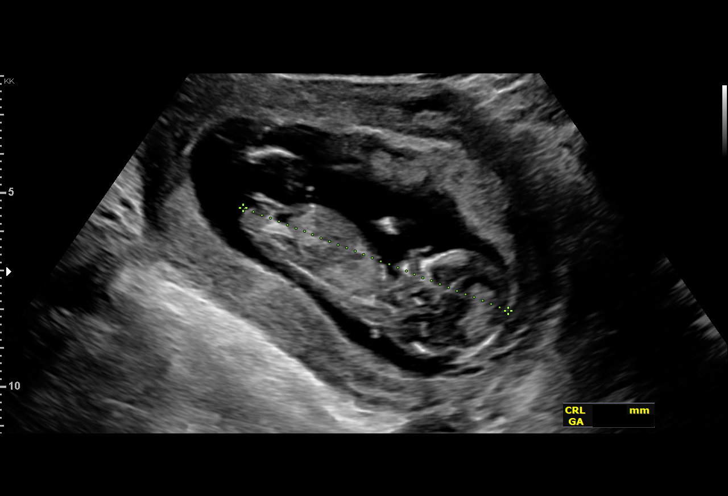
[im 26/50]
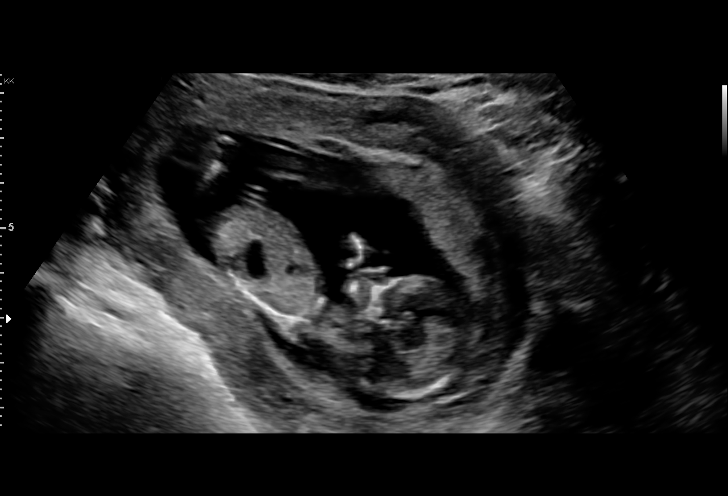
[im 28/50]
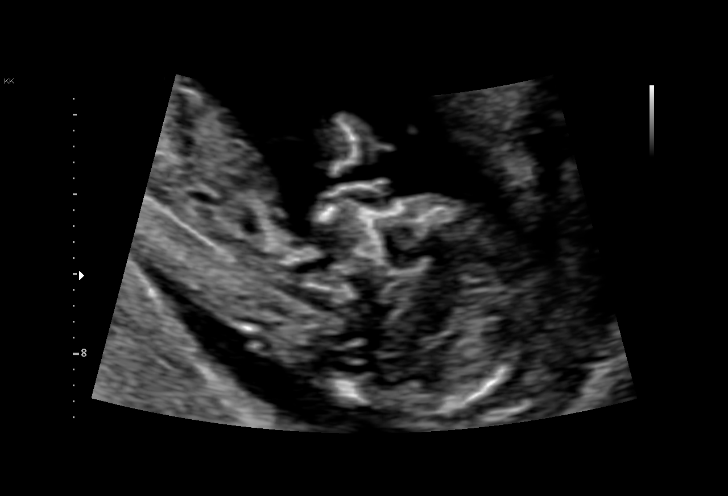
[im 31/50]
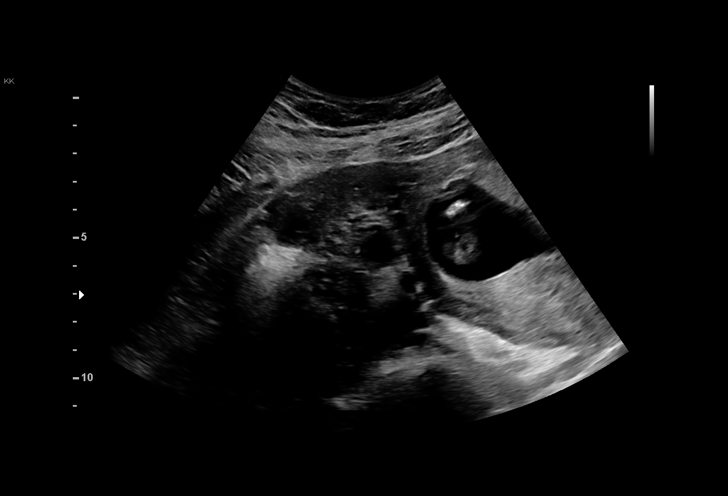
[im 35/50]
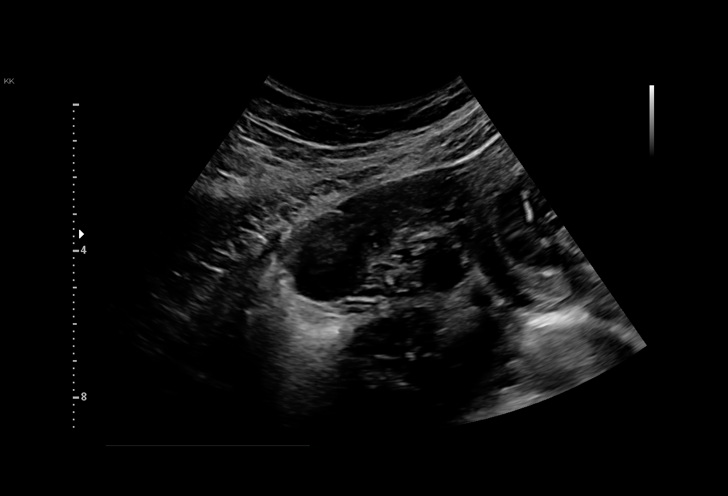
[im 39/50]
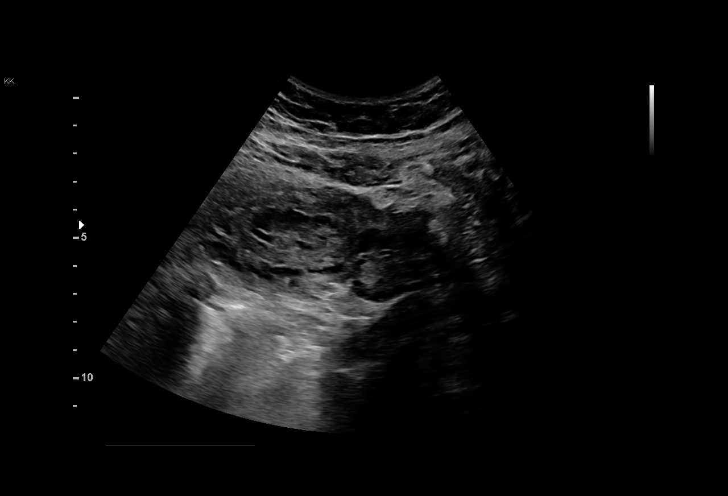
[im 42/50]
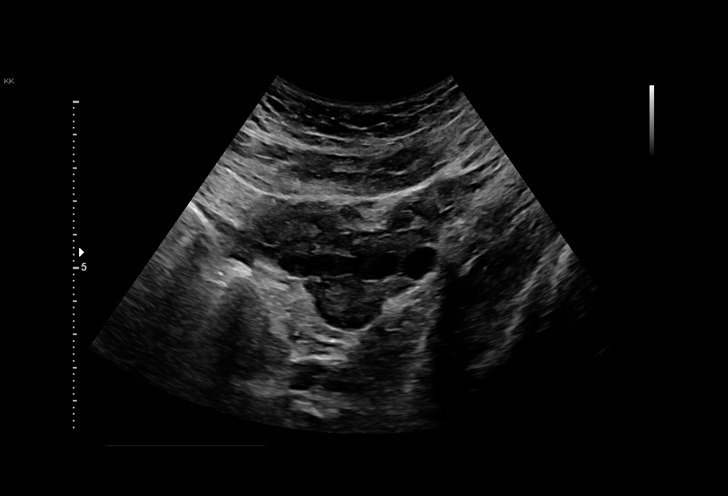
[im 46/50]
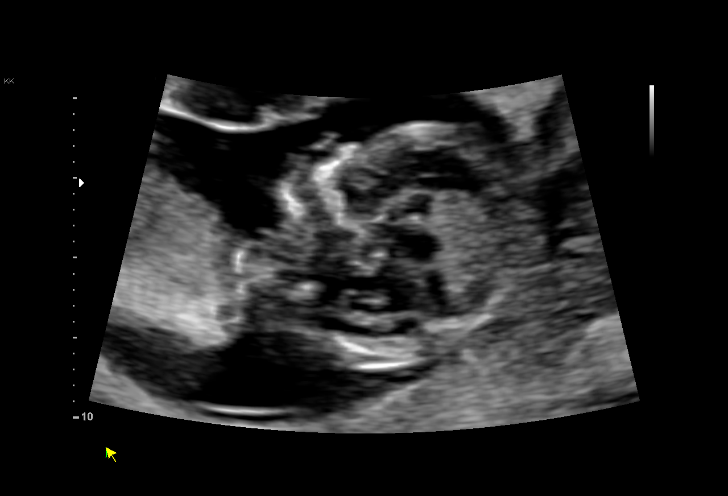
[im 50/50]
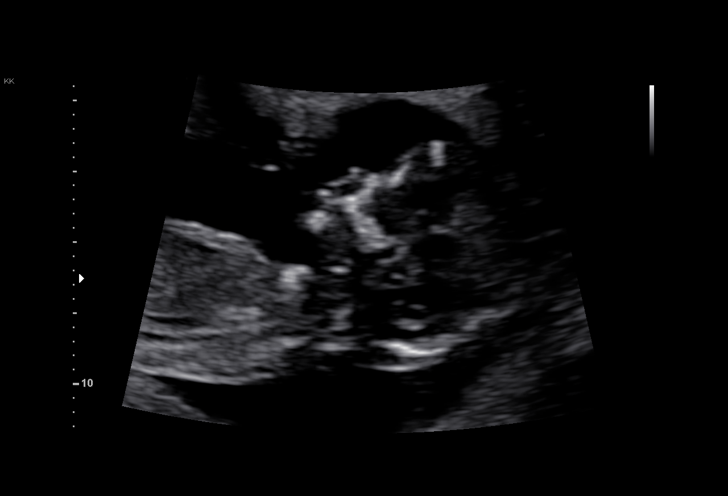

[15 of 28 positions shown; findings below may reference images not displayed]

FINDINGS: Intrauterine gestational sac: Present, single

Yolk sac:  Not visualized

Embryo:  Present

Cardiac Activity: Present

Heart Rate: 148 bpm

CRL:   72.5 mm   13 w 3 d                  US EDC: 11/09/2018

Subchorionic hemorrhage:  None visualized.

Maternal uterus/adnexae:

LEFT ovary normal size and morphology, 1.9 x 2.3 x 1.6 cm.

RIGHT ovary normal size and morphology, 2.1 x 3.3 x 1.9 cm.

No free pelvic fluid or adnexal masses.
IMPRESSION: Single live intrauterine gestation at 13 weeks 3 days EGA by
crown-rump length.

No acute abnormalities.
